# Patient Record
Sex: Male | Born: 1975 | Race: White | Hispanic: No | Marital: Married | Smoking: Never smoker
Health system: Southern US, Community
[De-identification: ages and names within clinical notes are randomized; demographics above are authoritative.]

## PROBLEM LIST (undated history)

## (undated) DIAGNOSIS — I10 Essential (primary) hypertension: Secondary | ICD-10-CM

## (undated) DIAGNOSIS — T7840XA Allergy, unspecified, initial encounter: Secondary | ICD-10-CM

## (undated) HISTORY — DX: Allergy, unspecified, initial encounter: T78.40XA

## (undated) HISTORY — PX: FINGER SURGERY: SHX640

## (undated) HISTORY — PX: SURGERY SCROTAL / TESTICULAR: SUR1316

## (undated) HISTORY — PX: SHOULDER SURGERY: SHX246

## (undated) HISTORY — DX: Essential (primary) hypertension: I10

## (undated) HISTORY — PX: OTHER SURGICAL HISTORY: SHX169

---

## 2009-06-11 ENCOUNTER — Emergency Department (HOSPITAL_COMMUNITY): Admission: EM | Admit: 2009-06-11 | Discharge: 2009-06-11 | Payer: Self-pay | Admitting: Emergency Medicine

## 2010-10-17 LAB — URINALYSIS, ROUTINE W REFLEX MICROSCOPIC
Bilirubin Urine: NEGATIVE
Nitrite: NEGATIVE
Specific Gravity, Urine: 1.024 (ref 1.005–1.030)
Urobilinogen, UA: 0.2 mg/dL (ref 0.0–1.0)
pH: 5.5 (ref 5.0–8.0)

## 2010-12-09 ENCOUNTER — Emergency Department (HOSPITAL_BASED_OUTPATIENT_CLINIC_OR_DEPARTMENT_OTHER)
Admission: EM | Admit: 2010-12-09 | Discharge: 2010-12-09 | Disposition: A | Discharge status: Home / Self Care | Payer: Commercial Managed Care - PPO | Attending: Emergency Medicine | Admitting: Emergency Medicine

## 2010-12-09 DIAGNOSIS — F988 Other specified behavioral and emotional disorders with onset usually occurring in childhood and adolescence: Secondary | ICD-10-CM | POA: Insufficient documentation

## 2010-12-09 DIAGNOSIS — L089 Local infection of the skin and subcutaneous tissue, unspecified: Secondary | ICD-10-CM | POA: Insufficient documentation

## 2011-01-01 IMAGING — US US SCROTUM
1 series · 14 of 25 positions shown · non-contrast
Comparison: None

CLINICAL DATA: Worsening right-sided testicular pain for 7 days.

SCROTAL ULTRASOUND
DOPPLER ULTRASOUND OF THE TESTICLES
TECHNIQUE: Complete ultrasound examination of the testicles,
epididymis, and other scrotal structures was performed.  Color and
spectral Doppler ultrasound were also utilized to evaluate blood
flow to the testicles.

[Series 1: us scrotum · 0.07mm/px · 14 of 37 slices shown]
[im 1/37]
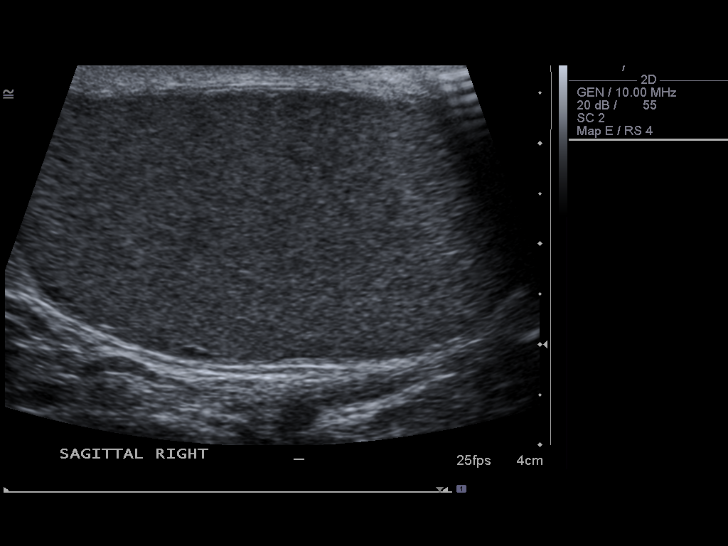
[im 4/37]
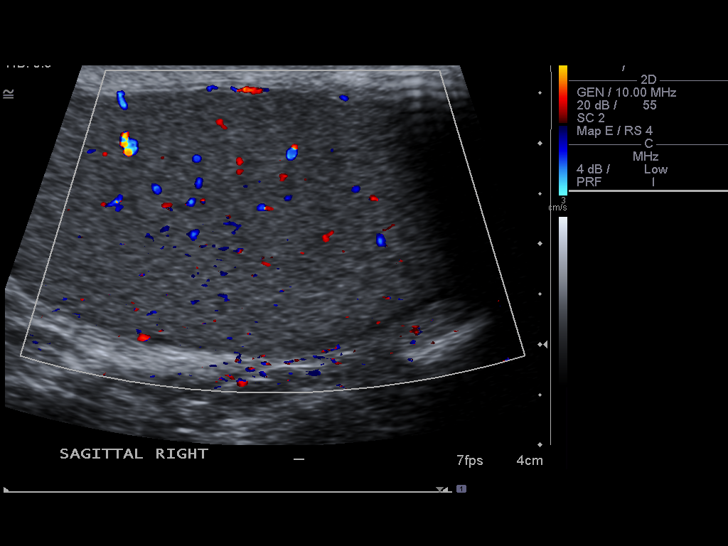
[im 7/37]
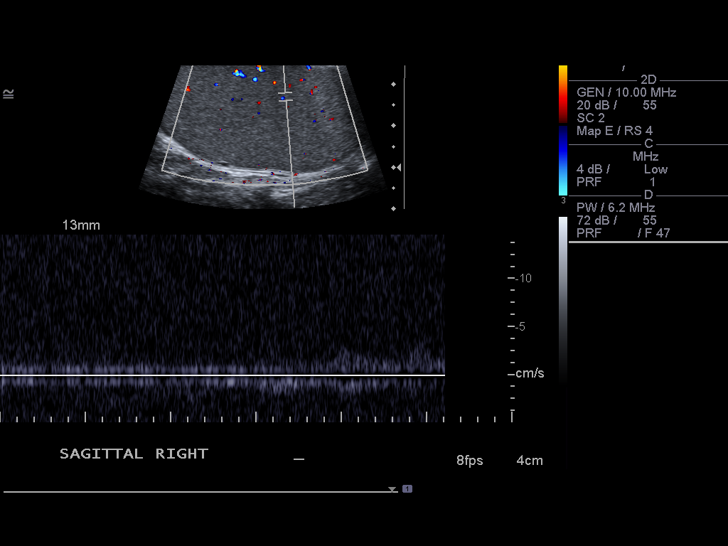
[im 10/37]
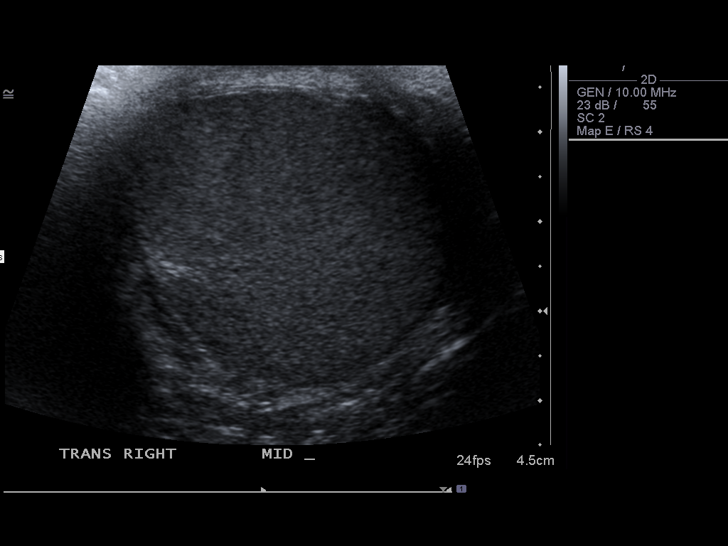
[im 13/37]
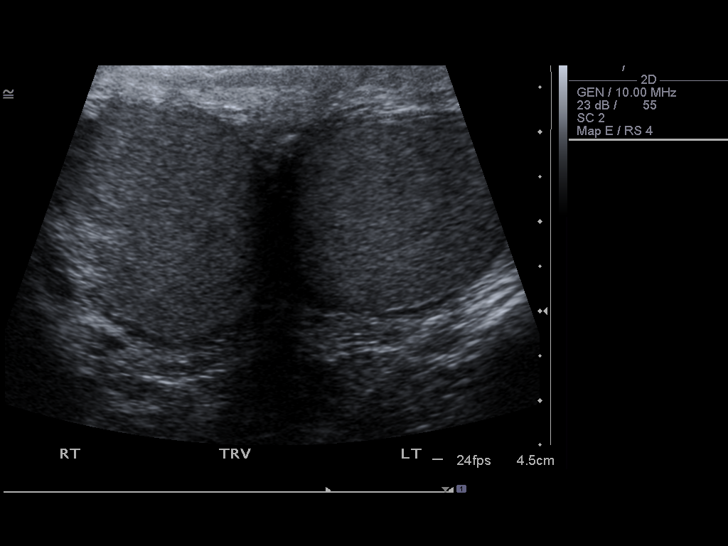
[im 14/37]
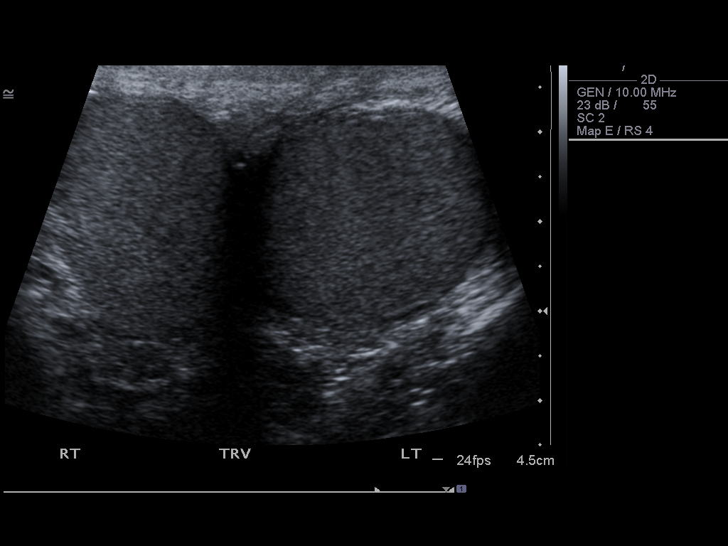
[im 17/37]
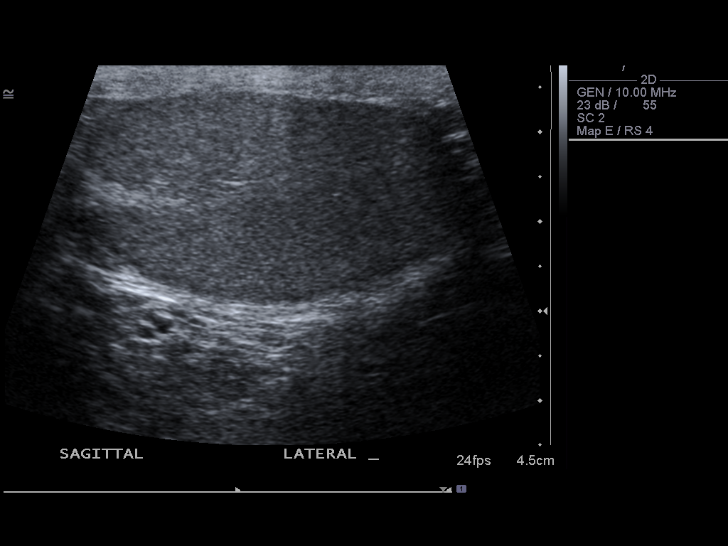
[im 20/37]
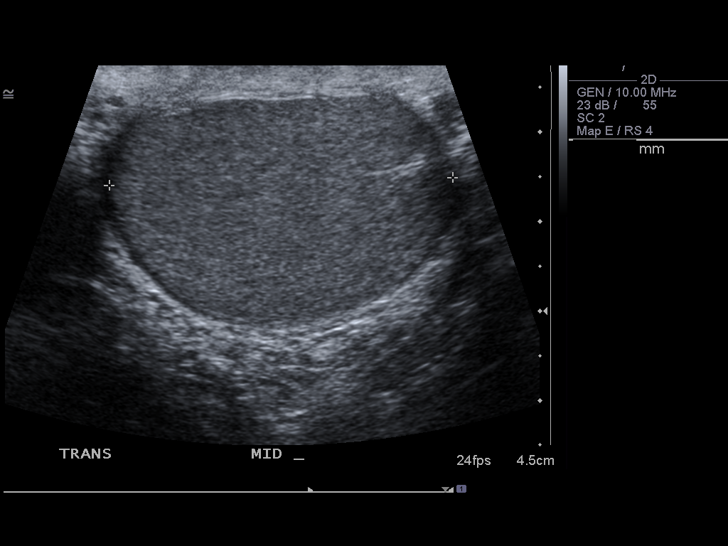
[im 23/37]
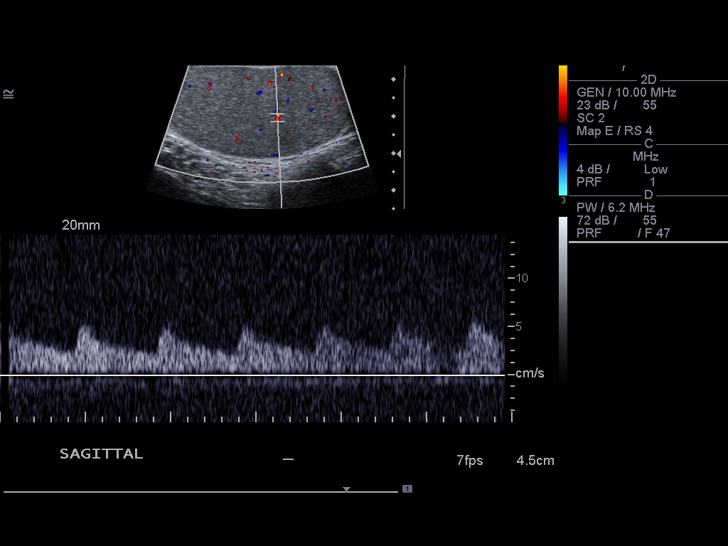
[im 25/37]
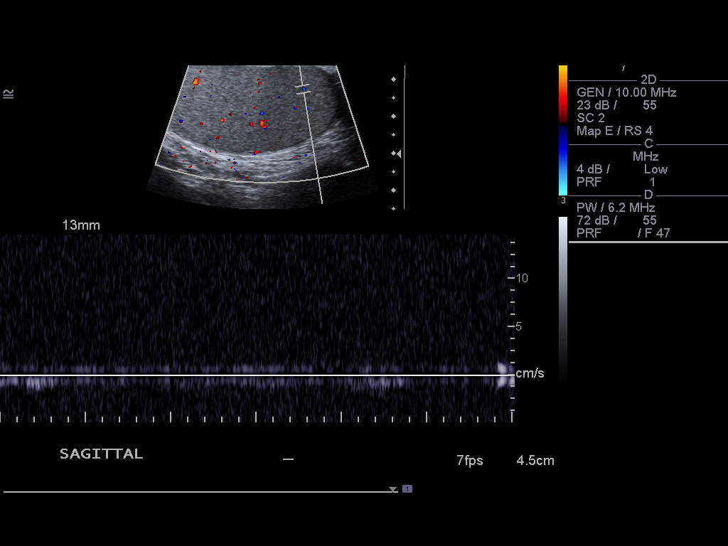
[im 28/37]
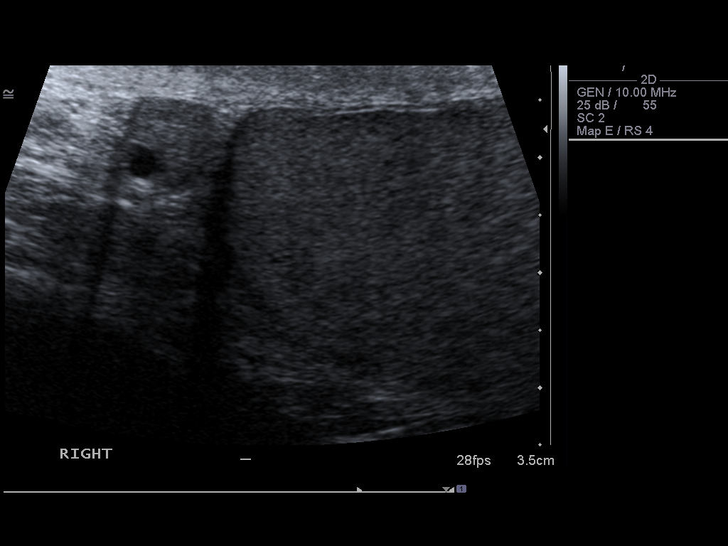
[im 31/37]
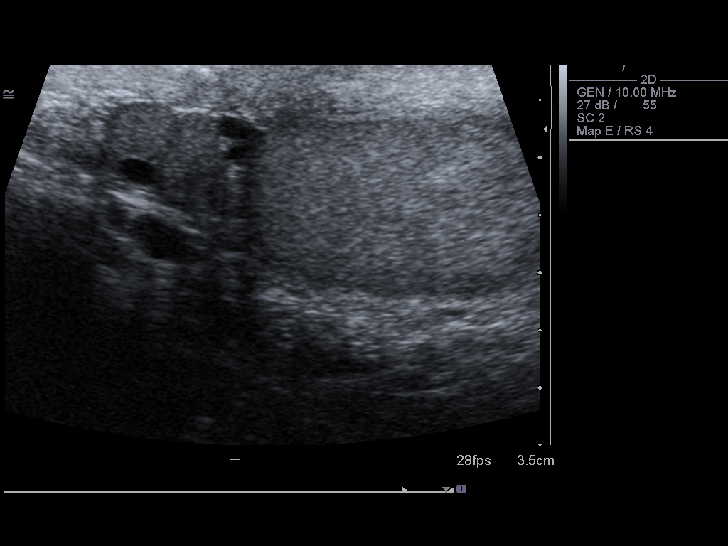
[im 34/37]
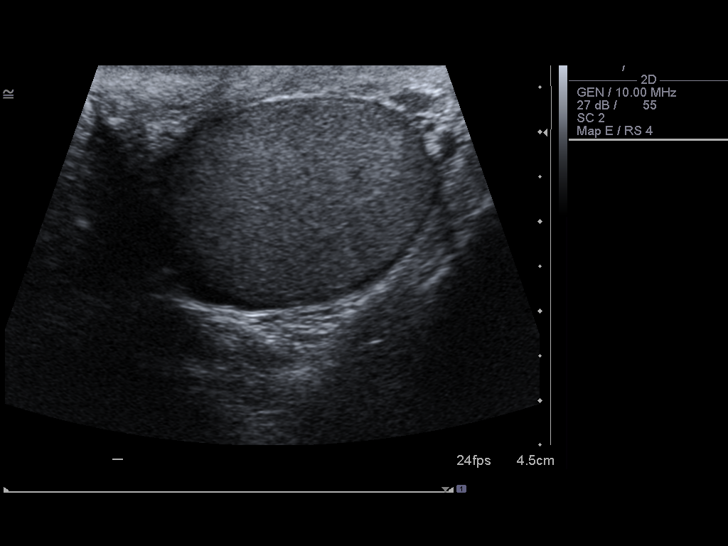
[im 37/37]
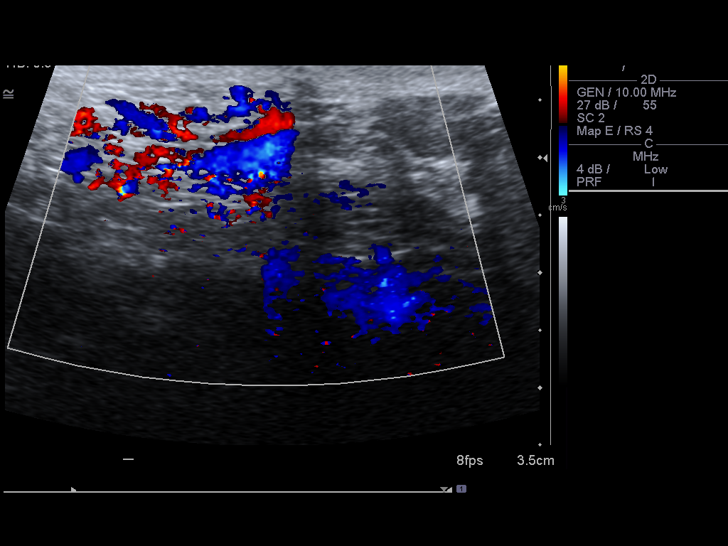

[14 of 25 positions shown; findings below may reference images not displayed]

FINDINGS: The testicles are symmetric in size and echogenicity.
No testicular masses are seen, and there is no evidence of
microlithiasis.  Several tiny cysts or spermatoceles are seen in
both epididymal heads measuring 5 mm or less.  There is no evidence
of hydrocele, varicocele, or other extra-testicular abnormality.

Blood flow is seen within both testicles on color Doppler
sonography.  Doppler spectral waveforms show both arterial and
venous flow signal in both testicles.
IMPRESSION: No acute findings.  No evidence of testicular mass or torsion.

## 2017-03-21 ENCOUNTER — Telehealth: Payer: Self-pay

## 2017-03-21 NOTE — Telephone Encounter (Signed)
[facility] NEED NAME OF PCP TO GET NOTES

## 2017-03-24 ENCOUNTER — Ambulatory Visit: Payer: Commercial Managed Care - PPO | Admitting: Cardiovascular Disease

## 2017-03-31 ENCOUNTER — Encounter: Payer: Self-pay | Admitting: Cardiovascular Disease

## 2017-04-01 ENCOUNTER — Encounter: Payer: Self-pay | Admitting: Cardiovascular Disease

## 2022-02-20 ENCOUNTER — Encounter: Payer: Self-pay | Admitting: Internal Medicine

## 2022-04-19 ENCOUNTER — Ambulatory Visit (AMBULATORY_SURGERY_CENTER): Payer: Self-pay | Admitting: *Deleted

## 2022-04-19 VITALS — Ht 72.0 in | Wt 229.4 lb

## 2022-04-19 DIAGNOSIS — Z1211 Encounter for screening for malignant neoplasm of colon: Secondary | ICD-10-CM

## 2022-04-19 MED ORDER — NA SULFATE-K SULFATE-MG SULF 17.5-3.13-1.6 GM/177ML PO SOLN: 1.0000 | Freq: Once | ORAL | 0 refills | Status: AC

## 2022-04-19 NOTE — Progress Notes (Signed)
No egg or soy allergy known to patient  No issues known to pt with past sedation with any surgeries or procedures Patient denies ever being told they had issues or difficulty with intubation  No FH of Malignant Hyperthermia Pt is not on diet pills Pt is not on  home 02  Pt is not on blood thinners  Pt denies issues with constipation  No A fib or A flutter Have any cardiac testing pending--no Pt instructed to use Singlecare.com or GoodRx for a price reduction on prep   

## 2022-05-02 ENCOUNTER — Encounter: Payer: Self-pay | Admitting: Gastroenterology

## 2022-05-06 ENCOUNTER — Encounter: Payer: Commercial Managed Care - PPO | Admitting: Internal Medicine

## 2022-05-10 ENCOUNTER — Ambulatory Visit (AMBULATORY_SURGERY_CENTER): Payer: BC Managed Care – PPO | Admitting: Gastroenterology

## 2022-05-10 ENCOUNTER — Encounter: Payer: Self-pay | Admitting: Gastroenterology

## 2022-05-10 VITALS — BP 110/62 | HR 77 | Temp 98.0°F | Resp 14 | Ht 72.0 in | Wt 229.0 lb

## 2022-05-10 DIAGNOSIS — Z1211 Encounter for screening for malignant neoplasm of colon: Secondary | ICD-10-CM

## 2022-05-10 DIAGNOSIS — D127 Benign neoplasm of rectosigmoid junction: Secondary | ICD-10-CM | POA: Diagnosis not present

## 2022-05-10 DIAGNOSIS — D120 Benign neoplasm of cecum: Secondary | ICD-10-CM

## 2022-05-10 DIAGNOSIS — D124 Benign neoplasm of descending colon: Secondary | ICD-10-CM

## 2022-05-10 DIAGNOSIS — K635 Polyp of colon: Secondary | ICD-10-CM | POA: Diagnosis not present

## 2022-05-10 DIAGNOSIS — D128 Benign neoplasm of rectum: Secondary | ICD-10-CM | POA: Diagnosis not present

## 2022-05-10 MED ORDER — SODIUM CHLORIDE 0.9 % IV SOLN: 500.0000 mL | Freq: Once | INTRAVENOUS | Status: DC

## 2022-05-10 NOTE — Progress Notes (Signed)
Called to room to assist during endoscopic procedure.  Patient ID and intended procedure confirmed with present staff. Received instructions for my participation in the procedure from the performing physician.  

## 2022-05-10 NOTE — Progress Notes (Signed)
A and O x3. Report to RN. Tolerated MAC anesthesia well. 

## 2022-05-10 NOTE — Progress Notes (Signed)
   Referring Provider: Thornton Park, MD Primary Care Physician:  Virginia Rochester, PA   Indication for Colonoscopy:  Colon cancer screening   IMPRESSION:  Need for colon cancer screening Appropriate candidate for monitored anesthesia care  PLAN: Colonoscopy in the [facility] today  HPI: Bill Turner is a 46 y.o. male presents for screening colonoscopy.  No prior colonoscopy or colon cancer screening.  No known family history of colon cancer or polyps. No family history of uterine/endometrial cancer, pancreatic cancer or gastric/stomach cancer.   Past Medical History:  Diagnosis Date   Allergy    seasonal   Hypertension     Past Surgical History:  Procedure Laterality Date   FINGER SURGERY Left    pinky tendon repair   prostate surgery     SHOULDER SURGERY Right    x2   SURGERY SCROTAL / TESTICULAR      Current Outpatient Medications  Medication Sig Dispense Refill   valsartan-hydrochlorothiazide (DIOVAN-HCT) 320-25 MG tablet Take 1 tablet by mouth daily.     acyclovir (ZOVIRAX) 400 MG tablet Take by mouth.     azelastine (OPTIVAR) 0.05 % ophthalmic solution Place 1 drop into both eyes 2 times daily.     Current Facility-Administered Medications  Medication Dose Route Frequency Provider Last Rate Last Admin   0.9 %  sodium chloride infusion  500 mL Intravenous Once Thornton Park, MD        Allergies as of 05/10/2022   (No Known Allergies)    Family History  Problem Relation Age of Onset   Cancer Mother        appendix   Colon polyps Neg Hx    Crohn's disease Neg Hx    Esophageal cancer Neg Hx    Rectal cancer Neg Hx    Stomach cancer Neg Hx    Ulcerative colitis Neg Hx      Physical Exam: General:   Alert,  well-nourished, pleasant and cooperative in NAD Head:  Normocephalic and atraumatic. Eyes:  Sclera clear, no icterus.   Conjunctiva pink. Mouth:  No deformity or lesions.   Neck:  Supple; no masses or thyromegaly. Lungs:  Clear  throughout to auscultation.   No wheezes. Heart:  Regular rate and rhythm; no murmurs. Abdomen:  Soft, non-tender, nondistended, normal bowel sounds, no rebound or guarding.  Msk:  Symmetrical. No boney deformities LAD: No inguinal or umbilical LAD Extremities:  No clubbing or edema. Neurologic:  Alert and  oriented x4;  grossly nonfocal Skin:  No obvious rash or bruise. Psych:  Alert and cooperative. Normal mood and affect.     Studies/Results: No results found.    Madysen Faircloth L. Tarri Glenn, MD, MPH 05/10/2022, 10:12 AM

## 2022-05-10 NOTE — Progress Notes (Signed)
Pt's states no medical or surgical changes since previsit or office visit. 

## 2022-05-10 NOTE — Patient Instructions (Signed)
-Handout on polyps and hemorrhoids provided. - Patient has a contact number available for emergencies. The signs and symptoms of potential delayed complications were discussed with the patient. Return to normal activities tomorrow. Written discharge instructions were provided to the patient. - Resume previous diet. - Continue present medications. - Await pathology results. - Repeat colonoscopy date to be determined after pending pathology results are reviewed for surveillance. - Emerging evidence supports eating a diet of fruits, vegetables, grains, calcium, and yogurt while reducing red meat and alcohol may reduce the risk of colon cancer. - Given these results, all first degree relatives (brothers, sisters, children, parents) should start colon cancer screening at age 14. - Thank you for allowing me to be involved in your colon cancer prevention.  YOU HAD AN ENDOSCOPIC PROCEDURE TODAY AT [facility]:   Refer to the procedure report that was given to you for any specific questions about what was found during the examination.  If the procedure report does not answer your questions, please call your gastroenterologist to clarify.  If you requested that your care partner not be given the details of your procedure findings, then the procedure report has been included in a sealed envelope for you to review at your convenience later.  YOU SHOULD EXPECT: Some feelings of bloating in the abdomen. Passage of more gas than usual.  Walking can help get rid of the air that was put into your GI tract during the procedure and reduce the bloating. If you had a lower endoscopy (such as a colonoscopy or flexible sigmoidoscopy) you may notice spotting of blood in your stool or on the toilet paper. If you underwent a bowel prep for your procedure, you may not have a normal bowel movement for a few days.  Please Note:  You might notice some irritation and congestion in your nose or some drainage.  This  is from the oxygen used during your procedure.  There is no need for concern and it should clear up in a day or so.  SYMPTOMS TO REPORT IMMEDIATELY:  Following lower endoscopy (colonoscopy or flexible sigmoidoscopy):  Excessive amounts of blood in the stool  Significant tenderness or worsening of abdominal pains  Swelling of the abdomen that is new, acute  Fever of 100F or higher  For urgent or emergent issues, a gastroenterologist can be reached at any hour by calling [phone]. Do not use MyChart messaging for urgent concerns.    DIET:  We do recommend a small meal at first, but then you may proceed to your regular diet.  Drink plenty of fluids but you should avoid alcoholic beverages for 24 hours.  ACTIVITY:  You should plan to take it easy for the rest of today and you should NOT DRIVE or use heavy machinery until tomorrow (because of the sedation medicines used during the test).    FOLLOW UP: Our staff will call the number listed on your records the next business day following your procedure.  We will call around 7:15- 8:00 am to check on you and address any questions or concerns that you may have regarding the information given to you following your procedure. If we do not reach you, we will leave a message.     If any biopsies were taken you will be contacted by phone or by letter within the next 1-3 weeks.  Please call us at [phone] if you have not heard about the biopsies in 3 weeks.    SIGNATURES/CONFIDENTIALITY: You  and/or your care partner have signed paperwork which will be entered into your electronic medical record.  These signatures attest to the fact that that the information above on your After Visit Summary has been reviewed and is understood.  Full responsibility of the confidentiality of this discharge information lies with you and/or your care-partner.

## 2022-05-10 NOTE — Op Note (Addendum)
[facility] Patient Name: Bill Turner Procedure Date: 05/10/2022 10:15 AM MRN: [id number] Endoscopist: Thornton Park MD, MD, [id number] Age: 46 Referring MD:  Date of Birth: [birth year] Gender: Male Account #: [id number] Procedure:                Colonoscopy Indications:              Screening for colorectal malignant neoplasm, This                            is the patient's first colonoscopy Medicines:                Monitored Anesthesia Care Procedure:                Pre-Anesthesia Assessment:                           - Prior to the procedure, a History and Physical                            was performed, and patient medications and                            allergies were reviewed. The patient's tolerance of                            previous anesthesia was also reviewed. The risks                            and benefits of the procedure and the sedation                            options and risks were discussed with the patient.                            All questions were answered, and informed consent                            was obtained. Prior Anticoagulants: The patient has                            taken no anticoagulant or antiplatelet agents. ASA                            Grade Assessment: II - A patient with mild systemic                            disease. After reviewing the risks and benefits,                            the patient was deemed in satisfactory condition to                            undergo the procedure.  After obtaining informed consent, the colonoscope                            was passed under direct vision. Throughout the                            procedure, the patient's blood pressure, pulse, and                            oxygen saturations were monitored continuously. The                            CF [id number] #[id number] was introduced through the anus                            and advanced to  the 3 cm into the ileum. The                            colonoscopy was performed without difficulty. The                            patient tolerated the procedure well. The quality                            of the bowel preparation was good. The terminal                            ileum, ileocecal valve, appendiceal orifice, and                            rectum were photographed. Scope In: 10:22:16 AM Scope Out: 10:40:09 AM Scope Withdrawal Time: 0 hours 14 minutes 24 seconds  Total Procedure Duration: 0 hours 17 minutes 53 seconds  Findings:                 The perianal and digital rectal examinations were                            normal.                           Non-bleeding internal hemorrhoids were found.                           Two sessile polyps were found in the recto-sigmoid                            colon. The polyps were 3 to 5 mm in size. These                            polyps were removed with a cold snare. Resection                            and retrieval were complete. Estimated blood loss  was minimal.                           A 15-18 mm polyp was found in the recto-sigmoid                            colon. The polyp was located 20 cm from the anal                            verge. The polyp was sessile. Area was successfully                            injected with 6 mL saline for a lift polypectomy.                            The polyp was removed with a hot snare. Resection                            and retrieval were complete. Estimated blood loss:                            none.                           A 12 mm polyp was found in the descending colon.                            The polyp was located 40 cm from the anal verge.                            The polyp was flat. The polyp was removed with a                            cold snare. Resection and retrieval were complete.                            Area was tattooed with  an injection of 2 mL of Spot                            (carbon black).                           A 5 mm polyp was found in the cecum. The polyp was                            flat. The polyp was removed with a cold snare.                            Resection and retrieval were complete. Estimated                            blood loss was minimal.  The exam was otherwise without abnormality on                            direct and retroflexion views. Complications:            No immediate complications. Estimated Blood Loss:     Estimated blood loss was minimal. Impression:               - Non-bleeding internal hemorrhoids.                           - One 15 mm polyp at the recto-sigmoid colon,                            removed with a hot snare. Resected and retrieved.                            Injected.                           - One 10 mm polyp in the descending colon, removed                            with a cold snare. Resected and retrieved. Tattooed.                           - Two 3 to 5 mm polyps at the recto-sigmoid colon,                            removed with a cold snare. Resected and retrieved.                           - One 5 mm polyp in the cecum, removed with a cold                            snare. Resected and retrieved.                           - The examination was otherwise normal on direct                            and retroflexion views. Recommendation:           - Patient has a contact number available for                            emergencies. The signs and symptoms of potential                            delayed complications were discussed with the                            patient. Return to normal activities tomorrow.  Written discharge instructions were provided to the                            patient.                           - Resume previous diet.                           - Continue present  medications.                           - Await pathology results.                           - Repeat colonoscopy date to be determined after                            pending pathology results are reviewed for                            surveillance.                           - Emerging evidence supports eating a diet of                            fruits, vegetables, grains, calcium, and yogurt                            while reducing red meat and alcohol may reduce the                            risk of colon cancer.                           - Given these results, all first degree relatives                            (brothers, sisters, children, parents) should start                            colon cancer screening at age 67.                           - Thank you for allowing me to be involved in your                            colon cancer prevention. Thornton Park MD, MD 05/10/2022 10:50:15 AM This report has been signed electronically.

## 2022-05-13 ENCOUNTER — Telehealth: Payer: Self-pay | Admitting: *Deleted

## 2022-05-13 NOTE — Telephone Encounter (Signed)
  Follow up Call-     05/10/2022    9:47 AM  Call back number  Post procedure Call Back phone  # [phone]  Permission to leave phone message Yes     Patient questions:  Do you have a fever, pain , or abdominal swelling? No. Pain Score  0 *  Have you tolerated food without any problems? Yes.    Have you been able to return to your normal activities? Yes.    Do you have any questions about your discharge instructions: Diet   No. Medications  No. Follow up visit  No.  Do you have questions or concerns about your Care? No.  Actions: * If pain score is 4 or above: No action needed, pain <4.

## 2022-05-23 ENCOUNTER — Encounter: Payer: Self-pay | Admitting: Gastroenterology

## 2022-09-17 ENCOUNTER — Encounter: Payer: Self-pay | Admitting: Emergency Medicine

## 2022-09-17 ENCOUNTER — Other Ambulatory Visit: Payer: Self-pay

## 2022-09-17 ENCOUNTER — Ambulatory Visit
Admission: EM | Admit: 2022-09-17 | Discharge: 2022-09-17 | Disposition: A | Discharge status: Home / Self Care | Payer: BC Managed Care – PPO | Attending: Family Medicine | Admitting: Family Medicine

## 2022-09-17 DIAGNOSIS — J101 Influenza due to other identified influenza virus with other respiratory manifestations: Secondary | ICD-10-CM | POA: Diagnosis not present

## 2022-09-17 LAB — POCT RAPID STREP A (OFFICE): Rapid Strep A Screen: NEGATIVE

## 2022-09-17 LAB — POCT INFLUENZA A/B
Influenza A, POC: POSITIVE — AB
Influenza B, POC: NEGATIVE

## 2022-09-17 MED ORDER — PROMETHAZINE-DM 6.25-15 MG/5ML PO SYRP: 5.0000 mL | ORAL_SOLUTION | Freq: Four times a day (QID) | ORAL | 0 refills | Status: AC | PRN

## 2022-09-17 MED ORDER — FLUTICASONE PROPIONATE 50 MCG/ACT NA SUSP: 1.0000 | Freq: Two times a day (BID) | NASAL | 2 refills | Status: AC

## 2022-09-17 MED ORDER — OSELTAMIVIR PHOSPHATE 75 MG PO CAPS: 75.0000 mg | ORAL_CAPSULE | Freq: Two times a day (BID) | ORAL | 0 refills | Status: AC

## 2022-09-17 NOTE — ED Triage Notes (Signed)
Pt reports sore throat, chills, fever, nasal congestion, cough x2 days. Pt inquiring about strep,flu,covid. Home covid test negative.

## 2022-09-17 NOTE — ED Provider Notes (Signed)
[facility]    CSN: [id number] Arrival date & time: 09/17/22  1707      History   Chief Complaint Chief Complaint  Patient presents with   Fever    Entered by patient    HPI STEPHENSON GOINGS is a 47 y.o. male.   Presenting today with 2-day history of sore throat, fever, chills, aches, congestion, cough.  Denies chest pain, shortness of breath, abdominal pain, nausea vomiting or diarrhea.  Taking over-the-counter cold and congestion medications, pain and fever reducers with minimal relief.  No known sick contacts recently.    Past Medical History:  Diagnosis Date   Allergy    seasonal   Hypertension     There are no problems to display for this patient.   Past Surgical History:  Procedure Laterality Date   FINGER SURGERY Left    pinky tendon repair   prostate surgery     SHOULDER SURGERY Right    x2   SURGERY SCROTAL / TESTICULAR         Home Medications    Prior to Admission medications   Medication Sig Start Date End Date Taking? Authorizing Provider  fluticasone (FLONASE) 50 MCG/ACT nasal spray Place 1 spray into both nostrils 2 (two) times daily. 09/17/22  Yes Volney American, PA-C  oseltamivir (TAMIFLU) 75 MG capsule Take 1 capsule (75 mg total) by mouth every 12 (twelve) hours. 09/17/22  Yes Volney American, PA-C  promethazine-dextromethorphan (PROMETHAZINE-DM) 6.25-15 MG/5ML syrup Take 5 mLs by mouth 4 (four) times daily as needed. 09/17/22  Yes Volney American, PA-C  acyclovir (ZOVIRAX) 400 MG tablet Take by mouth. 01/08/22   [provider]  azelastine (OPTIVAR) 0.05 % ophthalmic solution Place 1 drop into both eyes 2 times daily. 01/08/22   [provider]  valsartan-hydrochlorothiazide (DIOVAN-HCT) 320-25 MG tablet Take 1 tablet by mouth daily. 01/08/22   [provider]    Family History Family History  Problem Relation Age of Onset   Cancer Mother        appendix   Colon polyps Neg Hx     Crohn's disease Neg Hx    Esophageal cancer Neg Hx    Rectal cancer Neg Hx    Stomach cancer Neg Hx    Ulcerative colitis Neg Hx     Social History Social History   Tobacco Use   Smoking status: Never    Passive exposure: Past (parents smoked)   Smokeless tobacco: Never  Vaping Use   Vaping Use: Never used  Substance Use Topics   Alcohol use: Yes    Comment: every othe weekend   Drug use: Never     Allergies   Patient has no known allergies.   Review of Systems Review of Systems Per HPI  Physical Exam Triage Vital Signs ED Triage Vitals  Enc Vitals Group     BP 09/17/22 1807 105/66     Pulse Rate 09/17/22 1807 (!) 113     Resp 09/17/22 1807 20     Temp 09/17/22 1807 98.8 F (37.1 C)     Temp Source 09/17/22 1807 Oral     SpO2 09/17/22 1807 93 %     Weight --      Height --      Head Circumference --      Peak Flow --      Pain Score 09/17/22 1806 0     Pain Loc --      Pain Edu? --  Excl. in GC? --    No data found.  Updated Vital Signs BP 105/66 (BP Location: Right Arm)   Pulse (!) 113   Temp 98.8 F (37.1 C) (Oral)   Resp 20   SpO2 93%   Visual Acuity Right Eye Distance:   Left Eye Distance:   Bilateral Distance:    Right Eye Near:   Left Eye Near:    Bilateral Near:     Physical Exam Vitals and nursing note reviewed.  Constitutional:      Appearance: He is well-developed.  HENT:     Head: Atraumatic.     Right Ear: External ear normal.     Left Ear: External ear normal.     Nose: Rhinorrhea present.     Mouth/Throat:     Pharynx: Posterior oropharyngeal erythema present. No oropharyngeal exudate.  Eyes:     Conjunctiva/sclera: Conjunctivae normal.     Pupils: Pupils are equal, round, and reactive to light.  Cardiovascular:     Rate and Rhythm: Normal rate and regular rhythm.  Pulmonary:     Effort: Pulmonary effort is normal. No respiratory distress.     Breath sounds: No wheezing or rales.  Musculoskeletal:         General: Normal range of motion.     Cervical back: Normal range of motion and neck supple.  Lymphadenopathy:     Cervical: No cervical adenopathy.  Skin:    General: Skin is warm and dry.  Neurological:     Mental Status: He is alert and oriented to person, place, and time.  Psychiatric:        Behavior: Behavior normal.      UC Treatments / Results  Labs (all labs ordered are listed, but only abnormal results are displayed) Labs Reviewed  POCT INFLUENZA A/B - Abnormal; Notable for the following components:      Result Value   Influenza A, POC Positive (*)    All other components within normal limits  POCT RAPID STREP A (OFFICE)    EKG   Radiology No results found.  Procedures Procedures (including critical care time)  Medications Ordered in UC Medications - No data to display  Initial Impression / Assessment and Plan / UC Course  I have reviewed the triage vital signs and the nursing notes.  Pertinent labs & imaging results that were available during my care of the patient were reviewed by me and considered in my medical decision making (see chart for details).     Rapid flu positive, treat with Tamiflu, supportive over-the-counter medications, home care.  Work note given.  Return for worsening symptoms.  Final Clinical Impressions(s) / UC Diagnoses   Final diagnoses:  Influenza A   Discharge Instructions   None    ED Prescriptions     Medication Sig Dispense Auth. Provider   oseltamivir (TAMIFLU) 75 MG capsule Take 1 capsule (75 mg total) by mouth every 12 (twelve) hours. 10 capsule Volney American, PA-C   fluticasone [facility]) 50 MCG/ACT nasal spray Place 1 spray into both nostrils 2 (two) times daily. [place], [place]   promethazine-dextromethorphan (PROMETHAZINE-DM) 6.25-15 MG/5ML syrup Take 5 mLs by mouth 4 (four) times daily as needed. 100 mL Volney American, [place]      PDMP not reviewed this encounter.   Volney American, [place] 09/17/22 1928

## 2023-02-01 ENCOUNTER — Encounter (HOSPITAL_BASED_OUTPATIENT_CLINIC_OR_DEPARTMENT_OTHER): Payer: Self-pay

## 2023-02-01 ENCOUNTER — Emergency Department (HOSPITAL_BASED_OUTPATIENT_CLINIC_OR_DEPARTMENT_OTHER)
Admission: EM | Admit: 2023-02-01 | Discharge: 2023-02-01 | Disposition: A | Discharge status: Home / Self Care | Payer: BC Managed Care – PPO | Attending: Emergency Medicine | Admitting: Emergency Medicine

## 2023-02-01 ENCOUNTER — Emergency Department (HOSPITAL_BASED_OUTPATIENT_CLINIC_OR_DEPARTMENT_OTHER): Payer: BC Managed Care – PPO

## 2023-02-01 ENCOUNTER — Other Ambulatory Visit: Payer: Self-pay

## 2023-02-01 DIAGNOSIS — I10 Essential (primary) hypertension: Secondary | ICD-10-CM | POA: Insufficient documentation

## 2023-02-01 DIAGNOSIS — R1031 Right lower quadrant pain: Secondary | ICD-10-CM | POA: Diagnosis not present

## 2023-02-01 DIAGNOSIS — R1011 Right upper quadrant pain: Secondary | ICD-10-CM | POA: Insufficient documentation

## 2023-02-01 DIAGNOSIS — R109 Unspecified abdominal pain: Secondary | ICD-10-CM

## 2023-02-01 DIAGNOSIS — Z79899 Other long term (current) drug therapy: Secondary | ICD-10-CM | POA: Insufficient documentation

## 2023-02-01 LAB — URINALYSIS, ROUTINE W REFLEX MICROSCOPIC
Bacteria, UA: NONE SEEN
Bilirubin Urine: NEGATIVE
Glucose, UA: NEGATIVE mg/dL
Hgb urine dipstick: NEGATIVE
Nitrite: NEGATIVE
Specific Gravity, Urine: 1.026 (ref 1.005–1.030)
pH: 6 (ref 5.0–8.0)

## 2023-02-01 LAB — LIPASE, BLOOD: Lipase: 31 U/L (ref 11–51)

## 2023-02-01 LAB — COMPREHENSIVE METABOLIC PANEL
ALT: 25 U/L (ref 0–44)
AST: 19 U/L (ref 15–41)
Albumin: 4.8 g/dL (ref 3.5–5.0)
Alkaline Phosphatase: 46 U/L (ref 38–126)
Anion gap: 8 (ref 5–15)
BUN: 18 mg/dL (ref 6–20)
CO2: 29 mmol/L (ref 22–32)
Calcium: 10.1 mg/dL (ref 8.9–10.3)
Chloride: 102 mmol/L (ref 98–111)
Creatinine, Ser: 0.97 mg/dL (ref 0.61–1.24)
GFR, Estimated: >60 mL/min (ref >60)
Glucose, Bld: 92 mg/dL (ref 70–99)
Potassium: 4.7 mmol/L (ref 3.5–5.1)
Sodium: 139 mmol/L (ref 135–145)
Total Bilirubin: 0.6 mg/dL (ref 0.3–1.2)
Total Protein: 7.2 g/dL (ref 6.5–8.1)

## 2023-02-01 LAB — CBC
HCT: 50.3 % (ref 39.0–52.0)
Hemoglobin: 17 g/dL (ref 13.0–17.0)
MCH: 28.9 pg (ref 26.0–34.0)
MCHC: 33.8 g/dL (ref 30.0–36.0)
MCV: 85.5 fL (ref 80.0–100.0)
Platelets: 281 10*3/uL (ref 150–400)
RBC: 5.88 MIL/uL — ABNORMAL HIGH (ref 4.22–5.81)
RDW: 12.9 % (ref 11.5–15.5)
WBC: 10.4 10*3/uL (ref 4.0–10.5)
nRBC: 0 % (ref 0.0–0.2)

## 2023-02-01 MED ORDER — METHOCARBAMOL 500 MG PO TABS: 500.0000 mg | ORAL_TABLET | Freq: Three times a day (TID) | ORAL | 0 refills | Status: AC | PRN

## 2023-02-01 MED ORDER — IOHEXOL 300 MG/ML  SOLN
100.0000 mL | Freq: Once | INTRAMUSCULAR | Status: AC | PRN
  Administered 2023-02-01: 100 mL via INTRAVENOUS

## 2023-02-01 MED ORDER — KETOROLAC TROMETHAMINE 15 MG/ML IJ SOLN
15.0000 mg | Freq: Once | INTRAMUSCULAR | Status: AC
  Administered 2023-02-01: 15 mg via INTRAVENOUS
  Filled 2023-02-01: qty 1

## 2023-02-01 NOTE — ED Provider Notes (Signed)
[place] EMERGENCY DEPARTMENT AT [facility] Provider Note   CSN: [id number] Arrival date & time: 02/01/23  1232     History  Chief Complaint  Patient presents with   Abdominal Pain    Bill Turner is a 47 y.o. male.  Positive.   Abdominal Pain Patient present with right-sided abdominal pain.  Woke with this morning  Right lower abdomen going to upper abdomen and somewhat to the back.  No dysuria.  Worse with certain movements.  No fevers or chills.  States he had a bowel movement that was a little more painful on the right side.  Has had decreased appetite today.  No fevers.    Past Medical History:  Diagnosis Date   Allergy    seasonal   Hypertension      Home Medications Prior to Admission medications   Medication Sig Start Date End Date Taking? Authorizing Provider  methocarbamol (ROBAXIN) 500 MG tablet Take 1 tablet (500 mg total) by mouth every 8 (eight) hours as needed. 02/01/23  Yes Benjiman Core, MD  acyclovir (ZOVIRAX) 400 MG tablet Take by mouth. 01/08/22   [provider]  azelastine (OPTIVAR) 0.05 % ophthalmic solution Place 1 drop into both eyes 2 times daily. 01/08/22   [provider]  fluticasone (FLONASE) 50 MCG/ACT nasal spray Place 1 spray into both nostrils 2 (two) times daily. 09/17/22   Particia Nearing, PA-C  oseltamivir (TAMIFLU) 75 MG capsule Take 1 capsule (75 mg total) by mouth every 12 (twelve) hours. 09/17/22   Particia Nearing, PA-C  promethazine-dextromethorphan (PROMETHAZINE-DM) 6.25-15 MG/5ML syrup Take 5 mLs by mouth 4 (four) times daily as needed. 09/17/22   Particia Nearing, PA-C  valsartan-hydrochlorothiazide (DIOVAN-HCT) 320-25 MG tablet Take 1 tablet by mouth daily. 01/08/22   [provider]      Allergies    Patient has no known allergies.    Review of Systems   Review of Systems  Gastrointestinal:  Positive for abdominal pain.    Physical Exam Updated Vital Signs BP  116/75   Pulse 69   Temp 97.6 F (36.4 C) (Oral)   Resp 18   Ht 6' (1.829 m)   Wt 103.9 kg   SpO2 98%   BMI 31.06 kg/m  Physical Exam Vitals and nursing note reviewed.  Pulmonary:     Effort: Pulmonary effort is normal.  Abdominal:     Hernia: No hernia is present.     Comments: Right mid abdominal tenderness.  No definite right upper quadrant tenderness.  Bladder visualized ultrasound note tenderness with palpation at that site.  No stones in the gallbladder.  Neurological:     Mental Status: He is alert.     ED Results / Procedures / Treatments   Labs (all labs ordered are listed, but only abnormal results are displayed) Labs Reviewed  CBC - Abnormal; Notable for the following components:      Result Value   RBC 5.88 (*)    All other components within normal limits  URINALYSIS, ROUTINE W REFLEX MICROSCOPIC - Abnormal; Notable for the following components:   Ketones, ur TRACE (*)    Protein, ur TRACE (*)    Leukocytes,Ua TRACE (*)    All other components within normal limits  LIPASE, BLOOD  COMPREHENSIVE METABOLIC PANEL    EKG None  Radiology CT ABDOMEN PELVIS W CONTRAST  Result Date: 02/01/2023 CLINICAL DATA:  Right lower quadrant abdominal pain EXAM: CT ABDOMEN AND PELVIS WITH CONTRAST TECHNIQUE:  Multidetector CT imaging of the abdomen and pelvis was performed using the standard protocol following bolus administration of intravenous contrast. RADIATION DOSE REDUCTION: This exam was performed according to the departmental dose-optimization program which includes automated exposure control, adjustment of the mA and/or kV according to patient size and/or use of iterative reconstruction technique. CONTRAST:  OMNIPAQUE IOHEXOL 300 MG/ML  SOLN COMPARISON:  None Available. FINDINGS: Lower chest: No acute abnormality. Hepatobiliary: No focal liver abnormality is seen. No gallstones, gallbladder wall thickening, or biliary dilatation. Pancreas: Unremarkable. No pancreatic  ductal dilatation or surrounding inflammatory changes. Spleen: Normal in size without focal abnormality. Adrenals/Urinary Tract: Adrenal glands are unremarkable. Kidneys are normal, without renal calculi, focal lesion, or hydronephrosis. Bladder is unremarkable. Stomach/Bowel: Stomach is within normal limits. Appendix appears normal. No evidence of bowel wall thickening, distention, or inflammatory changes. Vascular/Lymphatic: No significant vascular findings are present. No enlarged abdominal or pelvic lymph nodes. Reproductive: Prostate is unremarkable. Other: No abdominal wall hernia or abnormality. No abdominopelvic ascites. Musculoskeletal: No acute or significant osseous findings. IMPRESSION: 1. No CT evidence of acute abdominal/pelvic process. 2. Normal appendix. No evidence of colitis or diverticulitis. 3. No evidence of nephrolithiasis or hydronephrosis. Electronically Signed   By: Larose Hires D.O.   On: 02/01/2023 16:01    Procedures Procedures    Medications Ordered in ED Medications  iohexol (OMNIPAQUE) 300 MG/ML solution 100 mL (100 mLs Intravenous Contrast Given 02/01/23 1541)  ketorolac (TORADOL) 15 MG/ML injection 15 mg (15 mg Intravenous Given 02/01/23 1706)    ED Course/ Medical Decision Making/ A&P                             Medical Decision Making Amount and/or Complexity of Data Reviewed Labs: ordered. Radiology: ordered.  Risk Prescription drug management.   Patient right side abdominal pain.  Began earlier today.  No dysuria.  Decreased appetite.  Differential diagnose includes biliary disease, kidney stones, appendicitis, nonspecific abdominal pain.  Blood work overall reassuring.  Will get CT scan to evaluate due to moderate pain. CT scan workup reassuring.  Toradol given.  Feels somewhat better.  Discharge home with 6 musculoskeletal treatment.  Even things such as shingles considered but no rash.       Final Clinical Impression(s) / ED Diagnoses Final  diagnoses:  Flank pain, acute    Rx / DC Orders ED Discharge Orders          Ordered    methocarbamol (ROBAXIN) 500 MG tablet  Every 8 hours PRN        02/01/23 Montey Hora, MD 02/01/23 1721

## 2023-02-01 NOTE — ED Triage Notes (Signed)
Patient reports pain worse with movement in right lower abd.  Started this am.  Denies n/v/d

## 2023-03-15 ENCOUNTER — Ambulatory Visit
Admission: RE | Admit: 2023-03-15 | Discharge: 2023-03-15 | Disposition: A | Discharge status: Home / Self Care | Payer: BC Managed Care – PPO | Source: Ambulatory Visit | Attending: Nurse Practitioner | Admitting: Nurse Practitioner

## 2023-03-15 VITALS — BP 113/75 | HR 74 | Temp 97.8°F | Resp 18

## 2023-03-15 DIAGNOSIS — Z1152 Encounter for screening for COVID-19: Secondary | ICD-10-CM | POA: Diagnosis not present

## 2023-03-15 DIAGNOSIS — B349 Viral infection, unspecified: Secondary | ICD-10-CM

## 2023-03-15 LAB — POCT INFLUENZA A/B
Influenza A, POC: NEGATIVE
Influenza B, POC: NEGATIVE

## 2023-03-15 LAB — POCT RAPID STREP A (OFFICE): Rapid Strep A Screen: NEGATIVE

## 2023-03-15 MED ORDER — LIDOCAINE VISCOUS HCL 2 % MT SOLN: OROMUCOSAL | 0 refills | Status: AC

## 2023-03-15 NOTE — ED Provider Notes (Signed)
[facility]    CSN: [id number] Arrival date & time: 03/15/23  0940      History   Chief Complaint Chief Complaint  Patient presents with   Fever    Sore throat - Entered by patient   Sore Throat    HPI Bill Turner is a 47 y.o. male.   The history is provided by the patient.   Patient presents for complaints of fever, fatigue, body aches, headache and sore throat that started over the past 24 hours.  Tmax 101.5.  Patient denies ear pain, ear drainage, nasal congestion, runny nose, cough, chest pain, abdominal pain, nausea, vomiting, or diarrhea.  Patient states his wife has been sick with the same or similar symptoms, his started first.  He reports he has taken Advil for his symptoms.  Patient states he took a COVID test at home 1 day ago which was negative.  Past Medical History:  Diagnosis Date   Allergy    seasonal   Hypertension     There are no problems to display for this patient.   Past Surgical History:  Procedure Laterality Date   FINGER SURGERY Left    pinky tendon repair   prostate surgery     SHOULDER SURGERY Right    x2   SURGERY SCROTAL / TESTICULAR         Home Medications    Prior to Admission medications   Medication Sig Start Date End Date Taking? Authorizing Provider  lidocaine (XYLOCAINE) 2 % solution Gargle and spit 5 mL every 6 hours as needed for throat pain or discomfort. 03/15/23  Yes Earnesteen Birnie-Warren, Sadie Haber, NP  acyclovir (ZOVIRAX) 400 MG tablet Take by mouth. 01/08/22   [provider]  azelastine (OPTIVAR) 0.05 % ophthalmic solution Place 1 drop into both eyes 2 times daily. 01/08/22   [provider]  fluticasone (FLONASE) 50 MCG/ACT nasal spray Place 1 spray into both nostrils 2 (two) times daily. 09/17/22   Particia Nearing, PA-C  methocarbamol (ROBAXIN) 500 MG tablet Take 1 tablet (500 mg total) by mouth every 8 (eight) hours as needed. 02/01/23   Benjiman Core, MD  oseltamivir (TAMIFLU) 75  MG capsule Take 1 capsule (75 mg total) by mouth every 12 (twelve) hours. 09/17/22   Particia Nearing, PA-C  promethazine-dextromethorphan (PROMETHAZINE-DM) 6.25-15 MG/5ML syrup Take 5 mLs by mouth 4 (four) times daily as needed. 09/17/22   Particia Nearing, PA-C  valsartan-hydrochlorothiazide (DIOVAN-HCT) 320-25 MG tablet Take 1 tablet by mouth daily. 01/08/22   [provider]    Family History Family History  Problem Relation Age of Onset   Cancer Mother        appendix   Colon polyps Neg Hx    Crohn's disease Neg Hx    Esophageal cancer Neg Hx    Rectal cancer Neg Hx    Stomach cancer Neg Hx    Ulcerative colitis Neg Hx     Social History Social History   Tobacco Use   Smoking status: Never    Passive exposure: Past (parents smoked)   Smokeless tobacco: Never  Vaping Use   Vaping status: Never Used  Substance Use Topics   Alcohol use: Yes    Comment: every othe weekend   Drug use: Never     Allergies   Patient has no known allergies.   Review of Systems Review of Systems Per HPI  Physical Exam Triage Vital Signs ED Triage Vitals [03/15/23 1018]  Encounter Vitals  Group     BP 113/75     Systolic BP Percentile      Diastolic BP Percentile      Pulse Rate 74     Resp 18     Temp 97.8 F (36.6 C)     Temp Source Oral     SpO2 95 %     Weight      Height      Head Circumference      Peak Flow      Pain Score 7     Pain Loc      Pain Education      Exclude from Growth Chart    No data found.  Updated Vital Signs BP 113/75 (BP Location: Right Arm)   Pulse 74   Temp 97.8 F (36.6 C) (Oral)   Resp 18   SpO2 95%   Visual Acuity Right Eye Distance:   Left Eye Distance:   Bilateral Distance:    Right Eye Near:   Left Eye Near:    Bilateral Near:     Physical Exam Vitals and nursing note reviewed.  Constitutional:      General: He is not in acute distress.    Appearance: He is well-developed.  HENT:     Head:  Normocephalic.     Right Ear: Tympanic membrane and ear canal normal.     Left Ear: Tympanic membrane and ear canal normal.     Mouth/Throat:     Lips: Pink.     Mouth: Mucous membranes are moist.     Pharynx: Uvula midline. Pharyngeal swelling, posterior oropharyngeal erythema and postnasal drip present. No oropharyngeal exudate or uvula swelling.     Tonsils: 1+ on the right. 1+ on the left.     Comments: Cobblestoning present to posterior oropharynx Eyes:     Conjunctiva/sclera: Conjunctivae normal.     Pupils: Pupils are equal, round, and reactive to light.  Cardiovascular:     Rate and Rhythm: Regular rhythm.     Heart sounds: Normal heart sounds.  Pulmonary:     Effort: Pulmonary effort is normal. No respiratory distress.     Breath sounds: Normal breath sounds. No stridor. No wheezing, rhonchi or rales.  Abdominal:     General: Bowel sounds are normal.     Palpations: Abdomen is soft.     Tenderness: There is no abdominal tenderness.  Musculoskeletal:     Cervical back: Normal range of motion.  Lymphadenopathy:     Cervical: No cervical adenopathy.  Skin:    General: Skin is warm and dry.  Neurological:     General: No focal deficit present.     Mental Status: He is alert and oriented to person, place, and time.  Psychiatric:        Mood and Affect: Mood normal.        Behavior: Behavior normal.      UC Treatments / Results  Labs (all labs ordered are listed, but only abnormal results are displayed) Labs Reviewed  SARS CORONAVIRUS 2 (TAT 6-24 HRS)  CULTURE, GROUP A STREP [facility])  POCT RAPID STREP A (OFFICE)  POCT INFLUENZA A/B    EKG   Radiology No results found.  Procedures Procedures (including critical care time)  Medications Ordered in UC Medications - No data to display  Initial Impression / Assessment and Plan / UC Course  I have reviewed the triage vital signs and the nursing notes.  Pertinent labs & imaging results that  were available  during my care of the patient were reviewed by me and considered in my medical decision making (see chart for details).  The patient is well-appearing, he is in no acute distress, vital signs are stable.  Rapid strep test and influenza test were negative.  Throat culture and COVID test are pending.  Patient is a candidate to receive Paxlovid if his COVID test is positive.  Viscous lidocaine 2% was provided for patient's throat pain and discomfort.  Supportive care recommendations were provided and discussed with the patient to include continuing over-the-counter analgesics, warm salt water gargles, and allowing for plenty of rest and increasing fluids.  Discussed viral etiology with the patient and when follow-up may be necessary.  Patient was in agreement with this plan of care and verbalizes understanding.  All questions were answered.  Patient stable for discharge.   Final Clinical Impressions(s) / UC Diagnoses   Final diagnoses:  Viral illness  Encounter for screening for COVID-19     Discharge Instructions      The influenza and rapid strep test were negative.  A throat culture and COVID test are pending.  You will be contacted if the pending test results are abnormal. Take medication as prescribed. Increase fluids and allow for plenty of rest. Continue over-the-counter analgesics for pain, fever, or general discomfort. For your throat pain, warm salt water gargles 3-4 times daily as needed for throat pain or discomfort. Also recommend a soft diet to include soup, broth, yogurt, pudding, Jell-O, or warm liquids. As discussed, if your COVID test is positive, if you have a fever, you will need to remain home and to have been fever free for 24 hours with no medication. If your pending test results are all negative, symptoms are most likely caused by a viral illness.  Viruses may persist for up to 10 days.  Follow-up in this clinic or with your primary care physician if symptoms worsen  before that time, or extend beyond that time or if you have other concerns. Follow-up as needed.     ED Prescriptions     Medication Sig Dispense Auth. Provider   lidocaine (XYLOCAINE) 2 % solution Gargle and spit 5 mL every 6 hours as needed for throat pain or discomfort. 100 mL Ashni Lonzo-Warren, Sadie Haber, NP      PDMP not reviewed this encounter.   Abran Cantor, NP 03/15/23 1104

## 2023-03-15 NOTE — Discharge Instructions (Addendum)
The influenza and rapid strep test were negative.  A throat culture and COVID test are pending.  You will be contacted if the pending test results are abnormal. Take medication as prescribed. Increase fluids and allow for plenty of rest. Continue over-the-counter analgesics for pain, fever, or general discomfort. For your throat pain, warm salt water gargles 3-4 times daily as needed for throat pain or discomfort. Also recommend a soft diet to include soup, broth, yogurt, pudding, Jell-O, or warm liquids. As discussed, if your COVID test is positive, if you have a fever, you will need to remain home and to have been fever free for 24 hours with no medication. If your pending test results are all negative, symptoms are most likely caused by a viral illness.  Viruses may persist for up to 10 days.  Follow-up in this clinic or with your primary care physician if symptoms worsen before that time, or extend beyond that time or if you have other concerns. Follow-up as needed.

## 2023-03-15 NOTE — ED Triage Notes (Signed)
Pt reports he had a fever, sore throat and headache x 1 day

## 2023-03-17 LAB — CULTURE, GROUP A STREP (THRC)

## 2023-03-17 LAB — SARS CORONAVIRUS 2 (TAT 6-24 HRS): SARS Coronavirus 2: NEGATIVE

## 2024-09-16 ENCOUNTER — Ambulatory Visit: Admitting: Family Medicine

## 2024-11-30 ENCOUNTER — Ambulatory Visit: Admitting: Family Medicine
